# Patient Record
Sex: Male | Born: 1985 | Race: White | Hispanic: No | Marital: Married | Smoking: Current every day smoker
Health system: Southern US, Community
[De-identification: ages and names within clinical notes are randomized; demographics above are authoritative.]

## PROBLEM LIST (undated history)

## (undated) DIAGNOSIS — F32A Depression, unspecified: Secondary | ICD-10-CM

## (undated) DIAGNOSIS — F419 Anxiety disorder, unspecified: Secondary | ICD-10-CM

## (undated) DIAGNOSIS — F909 Attention-deficit hyperactivity disorder, unspecified type: Secondary | ICD-10-CM

---

## 2020-12-23 ENCOUNTER — Encounter (HOSPITAL_BASED_OUTPATIENT_CLINIC_OR_DEPARTMENT_OTHER): Payer: Self-pay

## 2020-12-23 ENCOUNTER — Emergency Department (HOSPITAL_BASED_OUTPATIENT_CLINIC_OR_DEPARTMENT_OTHER): Payer: Self-pay

## 2020-12-23 ENCOUNTER — Emergency Department (HOSPITAL_BASED_OUTPATIENT_CLINIC_OR_DEPARTMENT_OTHER)
Admission: EM | Admit: 2020-12-23 | Discharge: 2020-12-23 | Disposition: A | Discharge status: Home / Self Care | Payer: Self-pay | Attending: Emergency Medicine | Admitting: Emergency Medicine

## 2020-12-23 ENCOUNTER — Other Ambulatory Visit: Payer: Self-pay

## 2020-12-23 DIAGNOSIS — R441 Visual hallucinations: Secondary | ICD-10-CM | POA: Insufficient documentation

## 2020-12-23 DIAGNOSIS — Z20822 Contact with and (suspected) exposure to covid-19: Secondary | ICD-10-CM | POA: Insufficient documentation

## 2020-12-23 DIAGNOSIS — R5383 Other fatigue: Secondary | ICD-10-CM | POA: Insufficient documentation

## 2020-12-23 DIAGNOSIS — R519 Headache, unspecified: Secondary | ICD-10-CM | POA: Insufficient documentation

## 2020-12-23 DIAGNOSIS — R443 Hallucinations, unspecified: Secondary | ICD-10-CM

## 2020-12-23 DIAGNOSIS — R202 Paresthesia of skin: Secondary | ICD-10-CM | POA: Insufficient documentation

## 2020-12-23 DIAGNOSIS — F17210 Nicotine dependence, cigarettes, uncomplicated: Secondary | ICD-10-CM | POA: Insufficient documentation

## 2020-12-23 HISTORY — DX: Depression, unspecified: F32.A

## 2020-12-23 HISTORY — DX: Anxiety disorder, unspecified: F41.9

## 2020-12-23 HISTORY — DX: Attention-deficit hyperactivity disorder, unspecified type: F90.9

## 2020-12-23 LAB — CBC WITH DIFFERENTIAL/PLATELET
Abs Immature Granulocytes: 0.07 10*3/uL (ref 0.00–0.07)
Basophils Absolute: 0.1 10*3/uL (ref 0.0–0.1)
Basophils Relative: 1 %
Eosinophils Absolute: 0.2 10*3/uL (ref 0.0–0.5)
Eosinophils Relative: 1 %
HCT: 54.9 % — ABNORMAL HIGH (ref 39.0–52.0)
Hemoglobin: 19.5 g/dL — ABNORMAL HIGH (ref 13.0–17.0)
Immature Granulocytes: 1 %
Lymphocytes Relative: 28 %
Lymphs Abs: 3.6 10*3/uL (ref 0.7–4.0)
MCH: 30.9 pg (ref 26.0–34.0)
MCHC: 35.5 g/dL (ref 30.0–36.0)
MCV: 87 fL (ref 80.0–100.0)
Monocytes Absolute: 0.7 10*3/uL (ref 0.1–1.0)
Monocytes Relative: 6 %
Neutro Abs: 8.3 10*3/uL — ABNORMAL HIGH (ref 1.7–7.7)
Neutrophils Relative %: 63 %
Platelets: 278 10*3/uL (ref 150–400)
RBC: 6.31 MIL/uL — ABNORMAL HIGH (ref 4.22–5.81)
RDW: 13.1 % (ref 11.5–15.5)
WBC: 13 10*3/uL — ABNORMAL HIGH (ref 4.0–10.5)
nRBC: 0 % (ref 0.0–0.2)

## 2020-12-23 LAB — COMPREHENSIVE METABOLIC PANEL
ALT: 42 U/L (ref 0–44)
AST: 37 U/L (ref 15–41)
Albumin: 4.9 g/dL (ref 3.5–5.0)
Alkaline Phosphatase: 81 U/L (ref 38–126)
Anion gap: 8 (ref 5–15)
BUN: 14 mg/dL (ref 6–20)
CO2: 28 mmol/L (ref 22–32)
Calcium: 9.1 mg/dL (ref 8.9–10.3)
Chloride: 100 mmol/L (ref 98–111)
Creatinine, Ser: 0.95 mg/dL (ref 0.61–1.24)
GFR, Estimated: >60 mL/min (ref >60)
Glucose, Bld: 92 mg/dL (ref 70–99)
Potassium: 3.5 mmol/L (ref 3.5–5.1)
Sodium: 136 mmol/L (ref 135–145)
Total Bilirubin: 1.1 mg/dL (ref 0.3–1.2)
Total Protein: 8.4 g/dL — ABNORMAL HIGH (ref 6.5–8.1)

## 2020-12-23 LAB — RESP PANEL BY RT-PCR (FLU A&B, COVID) ARPGX2
Influenza A by PCR: NEGATIVE
Influenza B by PCR: NEGATIVE
SARS Coronavirus 2 by RT PCR: NEGATIVE

## 2020-12-23 LAB — SALICYLATE LEVEL: Salicylate Lvl: <7 mg/dL — ABNORMAL LOW (ref 7.0–30.0)

## 2020-12-23 LAB — URINALYSIS, ROUTINE W REFLEX MICROSCOPIC
Bilirubin Urine: NEGATIVE
Glucose, UA: NEGATIVE mg/dL
Hgb urine dipstick: NEGATIVE
Ketones, ur: NEGATIVE mg/dL
Leukocytes,Ua: NEGATIVE
Nitrite: NEGATIVE
Protein, ur: NEGATIVE mg/dL
Specific Gravity, Urine: 1.01 (ref 1.005–1.030)
pH: 6 (ref 5.0–8.0)

## 2020-12-23 LAB — RAPID URINE DRUG SCREEN, HOSP PERFORMED
Amphetamines: NOT DETECTED
Barbiturates: NOT DETECTED
Benzodiazepines: NOT DETECTED
Cocaine: NOT DETECTED
Opiates: NOT DETECTED
Tetrahydrocannabinol: POSITIVE — AB

## 2020-12-23 LAB — ACETAMINOPHEN LEVEL: Acetaminophen (Tylenol), Serum: <10 ug/mL — ABNORMAL LOW (ref 10–30)

## 2020-12-23 LAB — TSH: TSH: 3.219 u[IU]/mL (ref 0.350–4.500)

## 2020-12-23 LAB — LIPASE, BLOOD: Lipase: 46 U/L (ref 11–51)

## 2020-12-23 MED ORDER — METOCLOPRAMIDE HCL 10 MG PO TABS: 10.0000 mg | ORAL_TABLET | Freq: Four times a day (QID) | ORAL | 0 refills | Status: AC

## 2020-12-23 MED ORDER — SODIUM CHLORIDE 0.9 % IV BOLUS
1000.0000 mL | Freq: Once | INTRAVENOUS | Status: AC
  Administered 2020-12-23: 1000 mL via INTRAVENOUS

## 2020-12-23 MED ORDER — METOCLOPRAMIDE HCL 5 MG/ML IJ SOLN
10.0000 mg | Freq: Once | INTRAMUSCULAR | Status: AC
  Administered 2020-12-23: 10 mg via INTRAVENOUS
  Filled 2020-12-23: qty 2

## 2020-12-23 MED ORDER — LORAZEPAM 1 MG PO TABS
0.5000 mg | ORAL_TABLET | Freq: Once | ORAL | Status: AC
  Administered 2020-12-23: 0.5 mg via ORAL
  Filled 2020-12-23: qty 1

## 2020-12-23 MED ORDER — DIPHENHYDRAMINE HCL 25 MG PO CAPS
25.0000 mg | ORAL_CAPSULE | Freq: Once | ORAL | Status: AC
  Administered 2020-12-23: 25 mg via ORAL
  Filled 2020-12-23: qty 1

## 2020-12-23 NOTE — ED Notes (Signed)
Pt pt states he was dr with strep  Last week and given rx for pen , wasn't feeling better so he went back and got amox  , them pt states he has numbness all over face hand and legs  , states he has felt foggy and just not feeling right , pt did drive himself to work today but had spouse  Come and get him because he felt even worse, states stopped the amox wasn't sure it was helping

## 2020-12-23 NOTE — ED Provider Notes (Signed)
[facility] EMERGENCY DEPARTMENT Provider Note   CSN: [id number] Arrival date & time: 12/23/20  1131     History Chief Complaint  Patient presents with  . Numbness    Ronald Frederick is a 35 y.o. male.  HPI Patient is a 35 year old male with past medical history significant for ADHD and depression.  He is taking Xanax and Cymbalta for these medications respectively.  He is been taking his Cymbalta as prescribed with no missed doses.  He states he takes Xanax only as needed and has not needed over the past few days.  Patient is presented today with 2 days of whole body paresthesias, mild visual hallucinations including the hand that he saw last night in the corner of his vision while he was watching television.  He also states that he has been seeing shadows moving around abnormally.  He states he has no history of hallucinations.  He denies any recreational drug use apart from occasional marijuana.  And denies any recent use.  He was seen in urgent care last week because he had some fatigue and felt that he had some congestion and sore throat tested positive for strep pharyngitis and negative for COVID and negative for flu.  Denies any chest pain shortness of breath lightheadedness or dizziness.  He states he has a achy constant circumferential headache with some associated nausea no visual changes.  He states he does not have a history of frequent migraines or headaches.  Denies any other significant associated symptoms.  No aggravating or mitigating factors.  No neck stiffness  No lightheadedness or dizziness.  States that he has also had a temperature at home of approximately 100.  Denies any SI, HI, AVH.  Denies any history of alcohol use apart from occasional use.  No history of withdrawal.  Past Medical History:  Diagnosis Date  . ADHD   . Anxiety   . Depression     There are no problems to display for this patient.   History reviewed. No pertinent surgical  history.     No family history on file.  Social History   Tobacco Use  . Smoking status: Current Every Day Smoker    Types: Cigarettes  . Smokeless tobacco: Never Used  Substance Use Topics  . Alcohol use: Yes    Comment: occ  . Drug use: Never    Home Medications Prior to Admission medications   Medication Sig Start Date End Date Taking? Authorizing Provider  ALPRAZolam Prudy Feeler) 0.25 MG tablet Take 0.25 mg by mouth at bedtime as needed for anxiety.   Yes [provider]  DULoxetine (CYMBALTA) 20 MG capsule Take 40 mg by mouth daily.   Yes [provider]  metoCLOPramide (REGLAN) 10 MG tablet Take 1 tablet (10 mg total) by mouth every 6 (six) hours. 12/23/20  Yes Solon Augusta S, PA    Allergies    Sulfa antibiotics  Review of Systems   Review of Systems  Constitutional: Positive for fatigue and fever. Negative for chills.  HENT: Negative for congestion.   Eyes: Negative for pain.  Respiratory: Negative for cough and shortness of breath.   Cardiovascular: Negative for chest pain and leg swelling.  Gastrointestinal: Negative for abdominal pain, diarrhea, nausea and vomiting.  Genitourinary: Negative for dysuria.  Musculoskeletal: Negative for myalgias.  Skin: Negative for rash.  Neurological: Positive for headaches. Negative for dizziness.       Diffuse body paresthesias    Physical Exam Updated Vital Signs BP 121/84 (  BP Location: Right Arm)   Pulse 71   Temp 98.5 F (36.9 C) (Oral)   Resp 16   Ht 5\' 11"  (1.803 m)   Wt 92.1 kg   SpO2 99%   BMI 28.31 kg/m   Physical Exam Vitals and nursing note reviewed.  Constitutional:      General: He is not in acute distress. HENT:     Head: Normocephalic and atraumatic.     Nose: Nose normal.     Mouth/Throat:     Mouth: Mucous membranes are dry.  Eyes:     General: No scleral icterus. Neck:     Comments: Neck is supple.  Able to flex chin to chest without difficulty. Cardiovascular:     Rate  and Rhythm: Normal rate and regular rhythm.     Pulses: Normal pulses.     Heart sounds: Normal heart sounds.  Pulmonary:     Effort: Pulmonary effort is normal. No respiratory distress.     Breath sounds: No wheezing.  Abdominal:     Palpations: Abdomen is soft.     Tenderness: There is no abdominal tenderness. There is no right CVA tenderness, left CVA tenderness, guarding or rebound.  Musculoskeletal:     Cervical back: Normal range of motion.     Right lower leg: No edema.     Left lower leg: No edema.  Skin:    General: Skin is warm and dry.     Capillary Refill: Capillary refill takes less than 2 seconds.  Neurological:     Mental Status: He is alert. Mental status is at baseline.     Comments: Alert and oriented to self, place, time and event.   Speech is fluent, clear without dysarthria or dysphasia.   Strength 5/5 in upper/lower extremities  Sensation intact in upper/lower extremities   Normal gait.  Normal finger-to-nose and feet tapping.  CN I not tested  CN II grossly intact visual fields bilaterally. Did not visualize posterior eye.   CN III, IV, VI PERRLA and EOMs intact bilaterally  CN V Intact sensation to sharp and light touch to the face  CN VII facial movements symmetric  CN VIII not tested  CN IX, X no uvula deviation, symmetric rise of soft palate  CN XI 5/5 SCM and trapezius strength bilaterally  CN XII Midline tongue protrusion, symmetric L/R movements   Psychiatric:        Mood and Affect: Mood normal.        Behavior: Behavior normal.     ED Results / Procedures / Treatments   Labs (all labs ordered are listed, but only abnormal results are displayed) Labs Reviewed  CBC WITH DIFFERENTIAL/PLATELET - Abnormal; Notable for the following components:      Result Value   WBC 13.0 (*)    RBC 6.31 (*)    Hemoglobin 19.5 (*)    HCT 54.9 (*)    Neutro Abs 8.3 (*)    All other components within normal limits  COMPREHENSIVE METABOLIC PANEL -  Abnormal; Notable for the following components:   Total Protein 8.4 (*)    All other components within normal limits  RAPID URINE DRUG SCREEN, HOSP PERFORMED - Abnormal; Notable for the following components:   Tetrahydrocannabinol POSITIVE (*)    All other components within normal limits  ACETAMINOPHEN LEVEL - Abnormal; Notable for the following components:   Acetaminophen (Tylenol), Serum <10 (*)    All other components within normal limits  SALICYLATE LEVEL - Abnormal;  Notable for the following components:   Salicylate Lvl <7.0 (*)    All other components within normal limits  RESP PANEL BY RT-PCR (FLU A&B, COVID) ARPGX2  LIPASE, BLOOD  URINALYSIS, ROUTINE W REFLEX MICROSCOPIC  TSH    EKG EKG Interpretation  Date/Time:  Monday Dec 23 2020 15:17:25 EDT Ventricular Rate:  66 PR Interval:  129 QRS Duration: 106 QT Interval:  404 QTC Calculation: 424 R Axis:   19 Text Interpretation: Sinus rhythm RSR' in V1 or V2, probably normal variant ST elev, probable normal early repol pattern No old tracing to compare Confirmed by Alona Bene [phone]) on 12/23/2020 3:18:58 PM   Radiology CT Head Wo Contrast  Addendum Date: 12/23/2020   ADDENDUM REPORT: 12/23/2020 16:26 ADDENDUM: Additional imaging through the skull base was acquired for the purposes of including the entirety of the right cerebellar tonsil. No abnormality is identified on the additional acquired imaging. Specifically, there is no right cerebellar tonsillar ectopia. Electronically Signed   By: Jackey Loge DO   On: 12/23/2020 16:26   Result Date: 12/23/2020 CLINICAL DATA:  Headache, new or worsening, neuro deficit. Diffuse body paresthesias with new headache. Additional history provided: Numbness all over face, hands and legs. EXAM: CT HEAD WITHOUT CONTRAST TECHNIQUE: Contiguous axial images were obtained from the base of the skull through the vertex without intravenous contrast. COMPARISON:  No pertinent prior exams available  for comparison. FINDINGS: Brain: Please note the caudal extent of the right cerebellar tonsil is not included in the field of view. Cerebral volume is normal. There is no acute intracranial hemorrhage. No demarcated cortical infarct. No extra-axial fluid collection. No evidence of intracranial mass. No midline shift. Vascular: No hyperdense vessel. Skull: Normal. Negative for fracture or focal lesion. Sinuses/Orbits: Visualized orbits show no acute finding. Small volume frothy secretions, mild mucosal thickening and small mucous retention cyst within the right sphenoid sinus. Mild mucosal thickening within the right frontal and right ethmoid sinuses. IMPRESSION: Please note the caudal extent of the right cerebellar tonsil is not included in the field of view. No evidence of acute intracranial abnormality. Right frontal, ethmoid and sphenoid sinusitis as described. Electronically Signed: By: Jackey Loge DO On: 12/23/2020 15:36    Procedures Procedures   Medications Ordered in ED Medications  LORazepam (ATIVAN) tablet 0.5 mg (0.5 mg Oral Given 12/23/20 1457)  metoCLOPramide (REGLAN) injection 10 mg (10 mg Intravenous Given 12/23/20 1458)  diphenhydrAMINE (BENADRYL) capsule 25 mg (25 mg Oral Given 12/23/20 1456)  sodium chloride 0.9 % bolus 1,000 mL (0 mLs Intravenous Stopped 12/23/20 1807)    ED Course  I have reviewed the triage vital signs and the nursing notes.  Pertinent labs & imaging results that were available during my care of the patient were reviewed by me and considered in my medical decision making (see chart for details).  Patient is a 35 year old male presenting today with multiple symptoms discussed in HPI primarily visual hallucinations, circumferential headache fatigue and full body paresthesias  Physical exam is unremarkable.  Notably have a relatively low concern for meningitis or encephalitis given normal neurologic exam, lack of fever, overall well-appearing patient and lack of  meningeal signs making meningitis unlikely.  He denies any SI, HI or auditory hallucinations he does have some visual hallucinations.  Recreational drug use and urine drug screen confirms that only THC positive.  Clinical Course as of 12/23/20 2212  Mon Dec 23, 2020  1700 CBC with evidence of marked dehydration with leukocytosis and erythrocytosis.  COVID test is negative salicylate and Tylenol negative.  Rapid drug screen positive for marijuana no other abnormalities.  Lipase within normal limits, CMP unremarkable with normal liver function.  Urinalysis unremarkable.  TSH pending at time of discharge patient understanding of this and will follow up on MyChart with PCP.  CT head reviewed by radiologist agrees radiology read this is an unremarkable head CT.  Initially there was a section of the right cerebellar tonsil that was not visualized.  CT tech brought patient back to CT scanner did additional images which were reviewed by radiology.  EKG without any acute abnormalities. [WF]  1701 EKG consistent with benign early repull.  Patient is having no chest pain. [WF]    Clinical Course User Index [WF] Gailen ShelterFondaw, Jadis Pitter S, [place]   I discussed this case with my attending physician who cosigned this note including patient's presenting symptoms, physical exam, and planned diagnostics and interventions. Attending physician stated agreement with plan or made changes to plan which were implemented.   Patient's husband is at bedside and he seems to have good support system.  I had shared decision-making conversation with patient about whether to stay for psychiatry consultation he states he would prefer to go home where he can have a consultation with his personal psychiatrist who knows his history.  I believe this is reasonable and patient seems to have good resources at home.  Denies any SI or HI.  Overall is well-appearing.  MDM Rules/Calculators/A&P                          TSH WNLs (reviewed after DC  of pt).   DC w reglan for nausea and headaches to take with benadryl .   Final Clinical Impression(s) / ED Diagnoses Final diagnoses:  Paresthesia  Nonintractable headache, unspecified chronicity pattern, unspecified headache type  Hallucination    Rx / DC Orders ED Discharge Orders         Ordered    metoCLOPramide (REGLAN) 10 MG tablet  Every 6 hours        12/23/20 1703           Gailen ShelterFondaw, Dany Harten S, [place] 12/23/20 2218    Maia PlanLong, Joshua G, MD 12/26/20 [phone]

## 2020-12-23 NOTE — ED Notes (Signed)
Pt states that he still feels the same , husband at bedside

## 2020-12-23 NOTE — ED Triage Notes (Signed)
Pt c/o numbness to body, visual hallucinations x 2 days-states he was seen at [facility] last week dx with step throat and neg covid and neg flu-NAD-steady gait

## 2020-12-23 NOTE — Discharge Instructions (Signed)
Your work-up today in the ER was largely unremarkable.  Because we have no findings it is reasonable for the next step of the work-up to be done with your psychiatrist.  If you experience any new or concerning symptoms may always return to the ER.  As we discussed, I did note that you do have some evidence of dehydration on your lab work.  This is evidenced by your increased blood cell counts.  I am prescribing you Reglan to use for headache.  This medication also helps with nausea should you experience that.  Please take with Benadryl 25 mg.

## 2022-04-07 IMAGING — CT CT HEAD W/O CM
3 series · 14 of 47 positions shown, 16 images · non-contrast
Comparison: No pertinent prior exams available for comparison.
COMPARISON: No pertinent prior exams available for comparison.

Addendum:
CLINICAL DATA: Headache, new or worsening, neuro deficit. Diffuse
body paresthesias with new headache. Additional history provided:
Numbness all over face, hands and legs.

EXAM:
CT HEAD WITHOUT CONTRAST
TECHNIQUE: Contiguous axial images were obtained from the base of the skull
through the vertex without intravenous contrast.

[Series 2: head wo · axial · 0.45mm/px · z∈[-164,-34]mm · 8 of 32 slices shown, 10 images]
[im 3/32  brain]
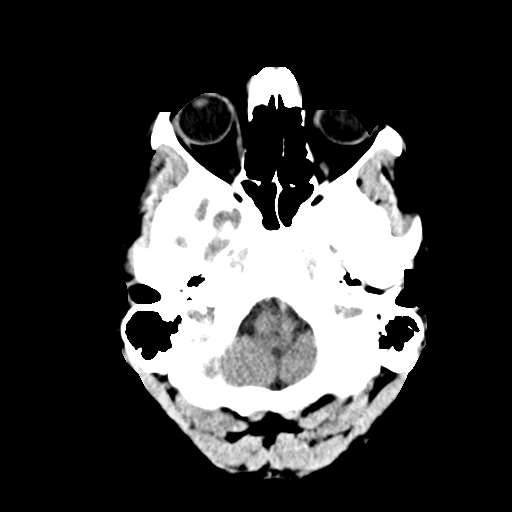
[im 3/32  bone]
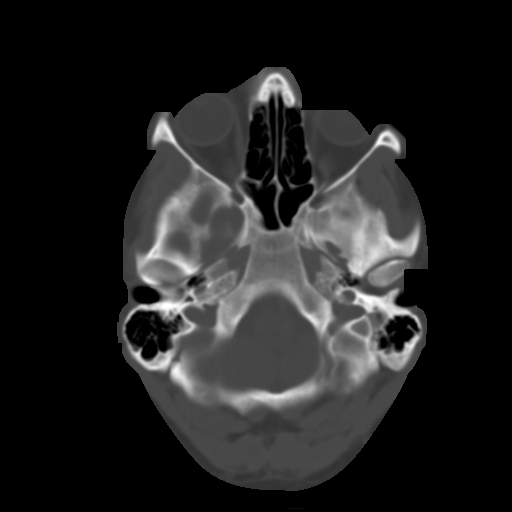
[im 7/32  brain]
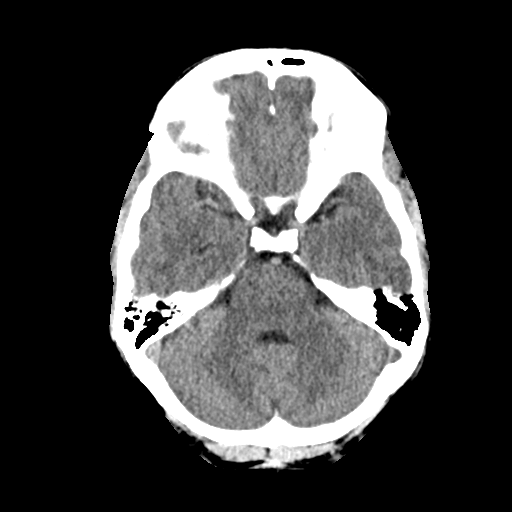
[im 10/32  brain]
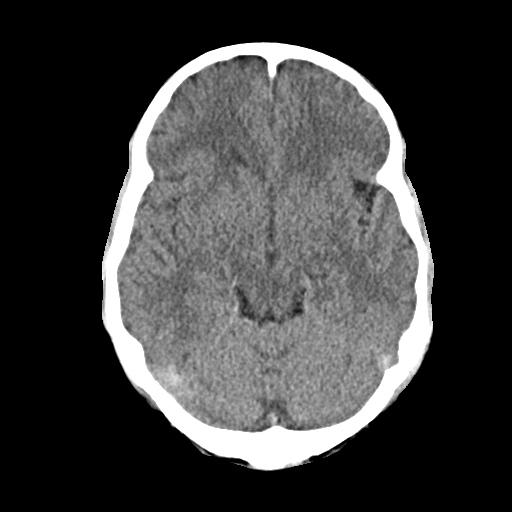
[im 14/32  brain]
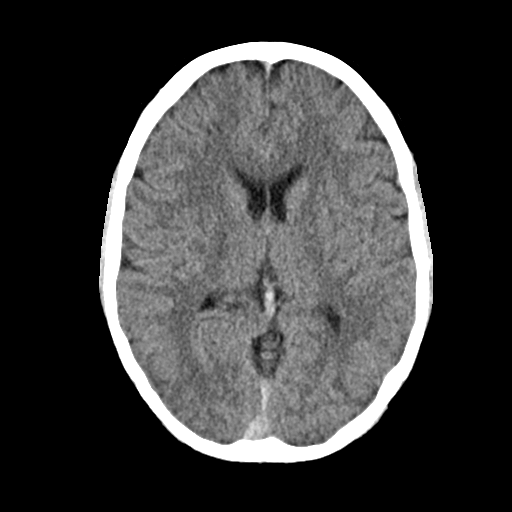
[im 18/32  brain]
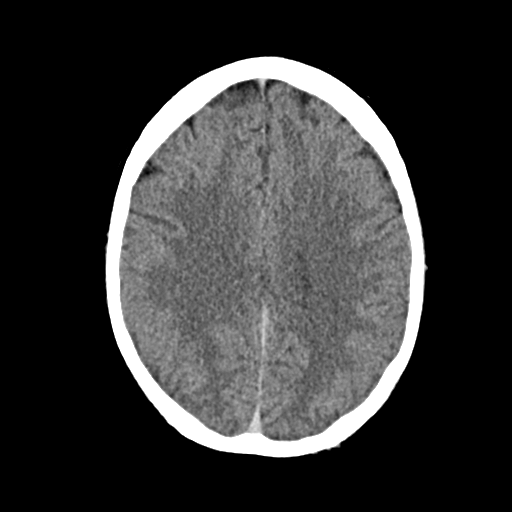
[im 18/32  bone]
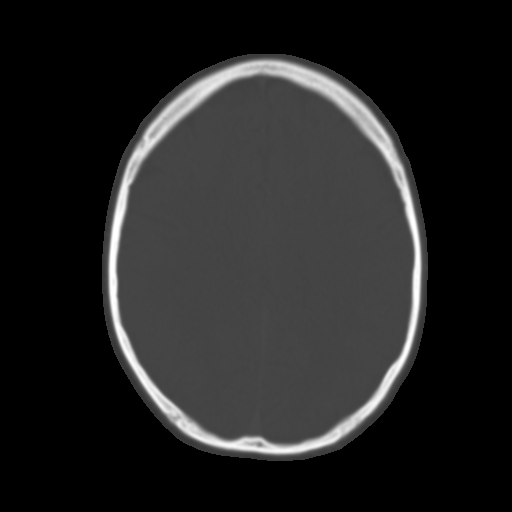
[im 22/32  brain]
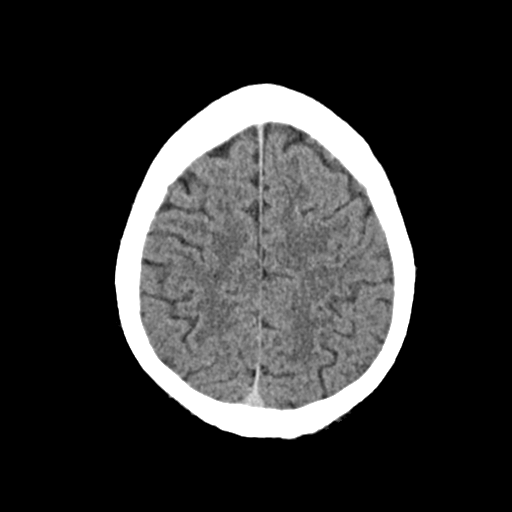
[im 25/32  brain]
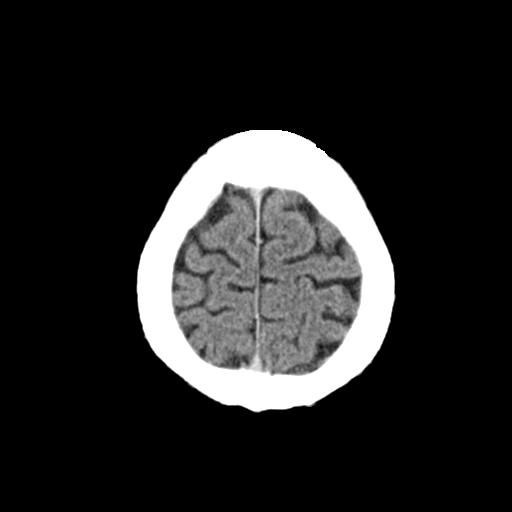
[im 29/32  brain]
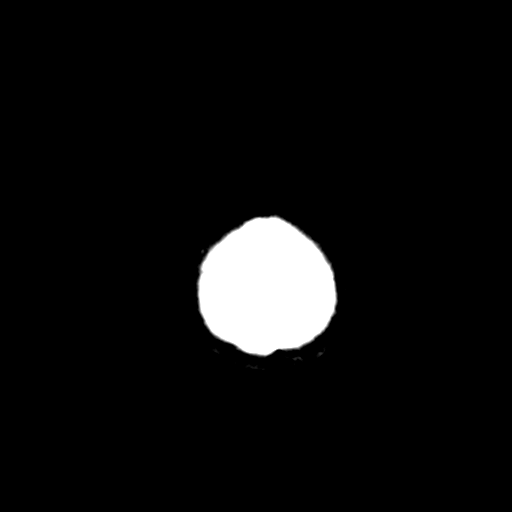

[Series 4: coronal soft · coronal · 0.32mm/px · 3 of 70 slices shown]
[im 24/70  brain]
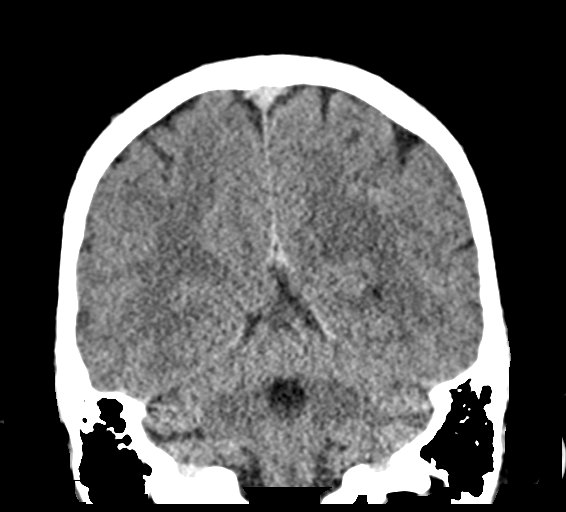
[im 31/70  brain]
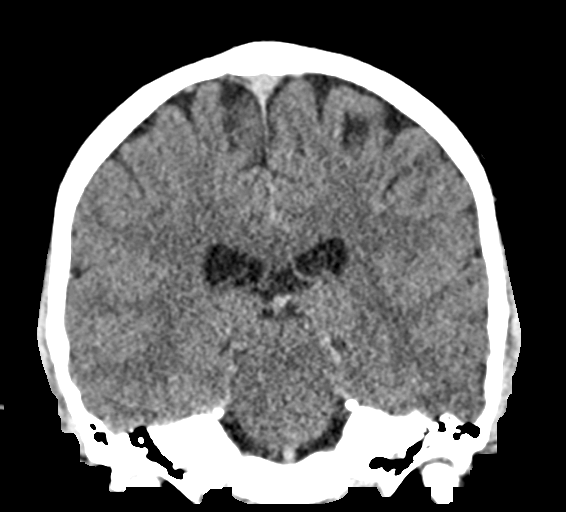
[im 39/70  brain]
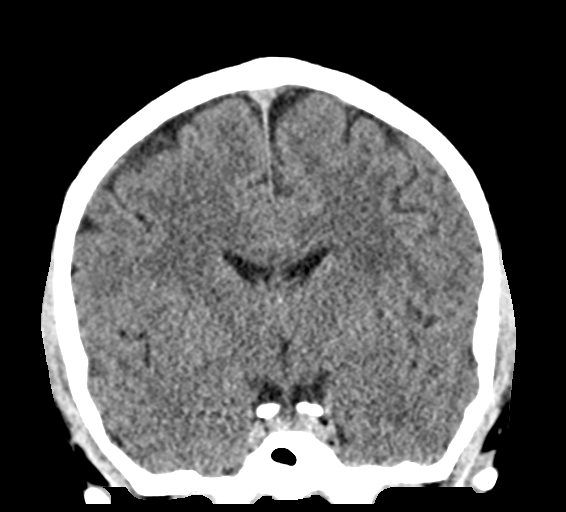

[Series 5: sag soft · sagittal · 0.33mm/px · 3 of 56 slices shown]
[im 19/56  brain]
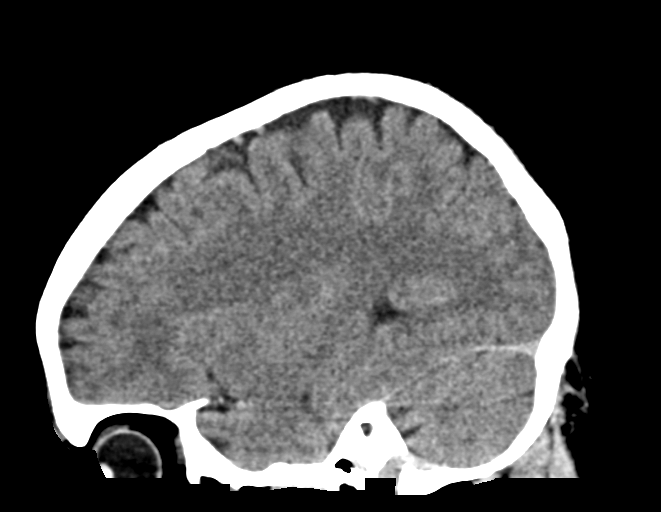
[im 28/56  brain]
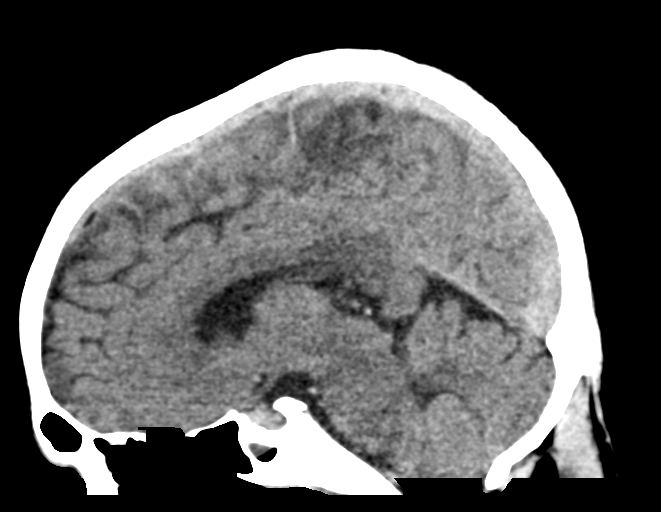
[im 37/56  brain]
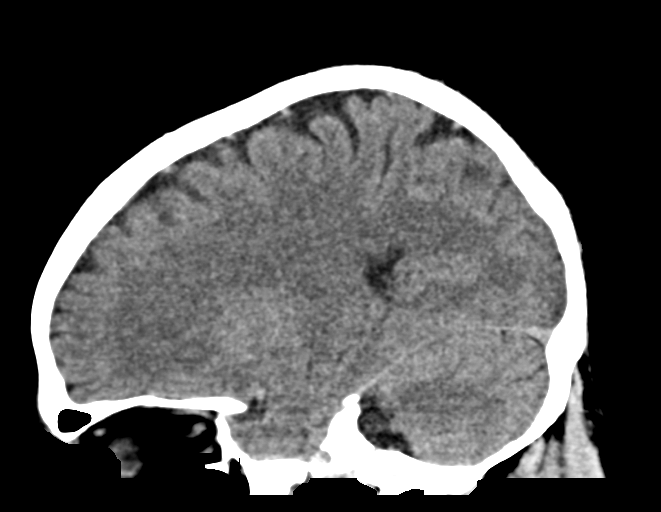

[14 of 47 positions shown; findings below may reference images not displayed]

FINDINGS: Brain:

Please note the caudal extent of the right cerebellar tonsil is not
included in the field of view.

Cerebral volume is normal.

There is no acute intracranial hemorrhage.

No demarcated cortical infarct.

No extra-axial fluid collection.

No evidence of intracranial mass.

No midline shift.

Vascular: No hyperdense vessel.

Skull: Normal. Negative for fracture or focal lesion.

Sinuses/Orbits: Visualized orbits show no acute finding. Small
volume frothy secretions, mild mucosal thickening and small mucous
retention cyst within the right sphenoid sinus. Mild mucosal
thickening within the right frontal and right ethmoid sinuses.
IMPRESSION: Please note the caudal extent of the right cerebellar tonsil is not
included in the field of view.

No evidence of acute intracranial abnormality.

Right frontal, ethmoid and sphenoid sinusitis as described.

ADDENDUM:
Additional imaging through the skull base was acquired for the
purposes of including the entirety of the right cerebellar tonsil.
No abnormality is identified on the additional acquired imaging.
Specifically, there is no right cerebellar tonsillar ectopia.

*** End of Addendum ***
FINDINGS: Brain:

Please note the caudal extent of the right cerebellar tonsil is not
included in the field of view.

Cerebral volume is normal.

There is no acute intracranial hemorrhage.

No demarcated cortical infarct.

No extra-axial fluid collection.

No evidence of intracranial mass.

No midline shift.

Vascular: No hyperdense vessel.

Skull: Normal. Negative for fracture or focal lesion.

Sinuses/Orbits: Visualized orbits show no acute finding. Small
volume frothy secretions, mild mucosal thickening and small mucous
retention cyst within the right sphenoid sinus. Mild mucosal
thickening within the right frontal and right ethmoid sinuses.
IMPRESSION: Please note the caudal extent of the right cerebellar tonsil is not
included in the field of view.

No evidence of acute intracranial abnormality.

Right frontal, ethmoid and sphenoid sinusitis as described.
